# Patient Record
Sex: Female | Born: 1950
Health system: Southern US, Community
[De-identification: ages and names within clinical notes are randomized; demographics above are authoritative.]

## PROBLEM LIST (undated history)

## (undated) DIAGNOSIS — K219 Gastro-esophageal reflux disease without esophagitis: Secondary | ICD-10-CM

## (undated) DIAGNOSIS — E785 Hyperlipidemia, unspecified: Secondary | ICD-10-CM

## (undated) DIAGNOSIS — M1990 Unspecified osteoarthritis, unspecified site: Secondary | ICD-10-CM

## (undated) DIAGNOSIS — K5790 Diverticulosis of intestine, part unspecified, without perforation or abscess without bleeding: Secondary | ICD-10-CM

## (undated) DIAGNOSIS — I10 Essential (primary) hypertension: Secondary | ICD-10-CM

## (undated) DIAGNOSIS — K2960 Other gastritis without bleeding: Secondary | ICD-10-CM

## (undated) DIAGNOSIS — J302 Other seasonal allergic rhinitis: Secondary | ICD-10-CM

## (undated) DIAGNOSIS — T7840XA Allergy, unspecified, initial encounter: Secondary | ICD-10-CM

## (undated) HISTORY — DX: Diverticulosis of intestine, part unspecified, without perforation or abscess without bleeding: K57.90

## (undated) HISTORY — DX: Essential (primary) hypertension: I10

## (undated) HISTORY — DX: Unspecified osteoarthritis, unspecified site: M19.90

## (undated) HISTORY — DX: Gastro-esophageal reflux disease without esophagitis: K21.9

## (undated) HISTORY — DX: Other seasonal allergic rhinitis: J30.2

## (undated) HISTORY — DX: Hyperlipidemia, unspecified: E78.5

## (undated) HISTORY — DX: Allergy, unspecified, initial encounter: T78.40XA

## (undated) HISTORY — PX: COLONOSCOPY: SHX174

## (undated) HISTORY — DX: Other gastritis without bleeding: K29.60

## (undated) HISTORY — PX: WISDOM TOOTH EXTRACTION: SHX21

## (undated) HISTORY — PX: POLYPECTOMY: SHX149

---
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
## 1985-10-17 HISTORY — PX: BREAST BIOPSY: SHX20

## 1985-10-17 HISTORY — PX: BREAST EXCISIONAL BIOPSY: SUR124

## 1999-07-08 ENCOUNTER — Other Ambulatory Visit: Admission: RE | Admit: 1999-07-08 | Discharge: 1999-07-08 | Payer: Self-pay | Admitting: Obstetrics & Gynecology

## 2001-05-01 ENCOUNTER — Other Ambulatory Visit: Admission: RE | Admit: 2001-05-01 | Discharge: 2001-05-01 | Payer: Self-pay | Admitting: Obstetrics & Gynecology

## 2001-06-29 ENCOUNTER — Ambulatory Visit (HOSPITAL_COMMUNITY): Admission: RE | Admit: 2001-06-29 | Discharge: 2001-06-29 | Payer: Self-pay | Admitting: Gastroenterology

## 2001-06-29 ENCOUNTER — Encounter: Payer: Self-pay | Admitting: Gastroenterology

## 2002-07-05 ENCOUNTER — Other Ambulatory Visit: Admission: RE | Admit: 2002-07-05 | Discharge: 2002-07-05 | Payer: Self-pay | Admitting: Obstetrics & Gynecology

## 2003-08-11 ENCOUNTER — Other Ambulatory Visit: Admission: RE | Admit: 2003-08-11 | Discharge: 2003-08-11 | Payer: Self-pay | Admitting: Family Medicine

## 2004-10-05 ENCOUNTER — Other Ambulatory Visit: Admission: RE | Admit: 2004-10-05 | Discharge: 2004-10-05 | Payer: Self-pay | Admitting: Obstetrics & Gynecology

## 2005-01-05 ENCOUNTER — Encounter: Admission: RE | Admit: 2005-01-05 | Discharge: 2005-01-05 | Payer: Self-pay | Admitting: Family Medicine

## 2005-05-16 ENCOUNTER — Ambulatory Visit: Payer: Self-pay | Admitting: Gastroenterology

## 2006-08-15 ENCOUNTER — Other Ambulatory Visit: Admission: RE | Admit: 2006-08-15 | Discharge: 2006-08-15 | Payer: Self-pay | Admitting: Family Medicine

## 2006-08-21 ENCOUNTER — Encounter: Admission: RE | Admit: 2006-08-21 | Discharge: 2006-08-21 | Payer: Self-pay | Admitting: Family Medicine

## 2007-08-23 ENCOUNTER — Encounter: Admission: RE | Admit: 2007-08-23 | Discharge: 2007-08-23 | Payer: Self-pay | Admitting: Family Medicine

## 2008-08-26 ENCOUNTER — Encounter: Admission: RE | Admit: 2008-08-26 | Discharge: 2008-08-26 | Payer: Self-pay | Admitting: Family Medicine

## 2008-09-01 ENCOUNTER — Other Ambulatory Visit: Admission: RE | Admit: 2008-09-01 | Discharge: 2008-09-01 | Payer: Self-pay | Admitting: Family Medicine

## 2008-09-05 ENCOUNTER — Encounter: Admission: RE | Admit: 2008-09-05 | Discharge: 2008-09-05 | Payer: Self-pay | Admitting: Family Medicine

## 2009-09-17 ENCOUNTER — Encounter: Admission: RE | Admit: 2009-09-17 | Discharge: 2009-09-17 | Payer: Self-pay | Admitting: Family Medicine

## 2010-02-03 ENCOUNTER — Encounter: Admission: RE | Admit: 2010-02-03 | Discharge: 2010-02-03 | Payer: Self-pay | Admitting: Family Medicine

## 2010-09-07 ENCOUNTER — Other Ambulatory Visit
Admission: RE | Admit: 2010-09-07 | Discharge: 2010-09-07 | Payer: Self-pay | Source: Home / Self Care | Admitting: Family Medicine

## 2010-11-12 ENCOUNTER — Other Ambulatory Visit: Payer: Self-pay | Admitting: Family Medicine

## 2010-11-12 DIAGNOSIS — Z1239 Encounter for other screening for malignant neoplasm of breast: Secondary | ICD-10-CM

## 2010-11-29 ENCOUNTER — Ambulatory Visit
Admission: RE | Admit: 2010-11-29 | Discharge: 2010-11-29 | Disposition: A | Discharge status: Home / Self Care | Payer: BC Managed Care – PPO | Source: Ambulatory Visit | Attending: Family Medicine | Admitting: Family Medicine

## 2010-11-29 DIAGNOSIS — Z1239 Encounter for other screening for malignant neoplasm of breast: Secondary | ICD-10-CM

## 2011-10-28 ENCOUNTER — Other Ambulatory Visit: Payer: Self-pay | Admitting: Family Medicine

## 2011-10-28 ENCOUNTER — Encounter: Payer: Self-pay | Admitting: Gastroenterology

## 2011-10-28 DIAGNOSIS — Z1231 Encounter for screening mammogram for malignant neoplasm of breast: Secondary | ICD-10-CM

## 2011-11-10 ENCOUNTER — Encounter: Payer: Self-pay | Admitting: Gastroenterology

## 2011-11-10 ENCOUNTER — Ambulatory Visit (INDEPENDENT_AMBULATORY_CARE_PROVIDER_SITE_OTHER): Payer: BC Managed Care – PPO | Admitting: Gastroenterology

## 2011-11-10 VITALS — BP 124/76 | HR 68 | Ht 65.0 in | Wt 190.0 lb

## 2011-11-10 DIAGNOSIS — K219 Gastro-esophageal reflux disease without esophagitis: Secondary | ICD-10-CM

## 2011-11-10 DIAGNOSIS — Z1211 Encounter for screening for malignant neoplasm of colon: Secondary | ICD-10-CM

## 2011-11-10 NOTE — Progress Notes (Signed)
History of Present Illness: This is a 61 year old female with a long history of GERD that is well controlled on daily pantoprazole. She has a history of an incomplete colonoscopy in September 2002 due to a tortuous colon. The exam was complete the hepatic flexure. A subsequent barium enema was performed that showed a tortuous colon and mild diverticulosis. Denies weight loss, abdominal pain, constipation, diarrhea, change in stool caliber, melena, hematochezia, nausea, vomiting, dysphagia,  chest pain.  Allergies  Allergen Reactions  . Tetracyclines & Related Nausea Only   No outpatient prescriptions prior to visit.   Past Medical History  Diagnosis Date  . Erosive gastritis   . Arthritis   . Seasonal allergic rhinitis   . Diverticulosis   . Gastric ulcer 06/2001  . Hyperlipemia   . Hypertension    Past Surgical History  Procedure Date  . Tonsillectomy 1970  . Cesarean section 1987  . Breast biopsy 1987   History   Social History  . Marital Status: Married    Spouse Name: N/A    Number of Children: 1  . Years of Education: N/A   Occupational History  . [employer]    Social History Main Topics  . Smoking status: Never Smoker   . Smokeless tobacco: Never Used  . Alcohol Use: No  . Drug Use: No  . Sexually Active: None   Other Topics Concern  . None   Social History Narrative   Caffeine daily    Family History  Problem Relation Age of Onset  . Colon polyps Mother   . Diverticulitis Mother   . Uterine cancer Paternal Aunt   . Colon cancer Neg Hx   . Heart disease Father    Review of Systems: Pertinent positive and negative review of systems were noted in the above HPI section. All other review of systems were otherwise negative.  Physical Exam: General: Well developed , well nourished, no acute distress Head: Normocephalic and atraumatic Eyes:  sclerae anicteric, EOMI Ears: Normal auditory acuity Mouth: No deformity or lesions Neck: Supple, no masses or  thyromegaly Lungs: Clear throughout to auscultation Heart: Regular rate and rhythm; no murmurs, rubs or bruits Abdomen: Soft, non tender and non distended. No masses, hepatosplenomegaly or hernias noted. Normal Bowel sounds Rectal: Deferred to colonoscopy Musculoskeletal: Symmetrical with no gross deformities  Skin: No lesions on visible extremities Pulses:  Normal pulses noted Extremities: No clubbing, cyanosis, edema or deformities noted Neurological: Alert oriented x 4, grossly nonfocal Cervical Nodes:  No significant cervical adenopathy Inguinal Nodes: No significant inguinal adenopathy Psychological:  Alert and cooperative. Normal mood and affect  Assessment and Recommendations:  1. Colorectal cancer screening. Average risk. History of a tortuous colon with an incomplete colonoscopy in 2002. Deeper sedation with propofol and improved colonoscopes are likely to allow a complete examination to the cecum. Other options offered were a CT colonoscopy and air-contrast barium enema with flexible sigmoidoscopy. She prefers to proceed with colonoscopy with propofol sedation. The risks, benefits, and alternatives to colonoscopy with possible biopsy and possible polypectomy were discussed with the patient and they consent to proceed.   2. GERD. Continue standard antireflux measures and pantoprazole 40 mg daily.

## 2011-11-10 NOTE — Patient Instructions (Signed)
Please call back to schedule a Colonoscopy with propofol and a nurse visit to get your instructions.  Please let the nurse know that you need to get reglan prior to each prep dose that before your procedure.  cc: Blair Heys, MD

## 2011-12-12 ENCOUNTER — Ambulatory Visit
Admission: RE | Admit: 2011-12-12 | Discharge: 2011-12-12 | Disposition: A | Discharge status: Home / Self Care | Payer: BC Managed Care – PPO | Source: Ambulatory Visit | Attending: Family Medicine | Admitting: Family Medicine

## 2011-12-12 DIAGNOSIS — Z1231 Encounter for screening mammogram for malignant neoplasm of breast: Secondary | ICD-10-CM

## 2011-12-13 ENCOUNTER — Telehealth: Payer: Self-pay | Admitting: *Deleted

## 2011-12-13 ENCOUNTER — Encounter: Payer: Self-pay | Admitting: Gastroenterology

## 2011-12-13 ENCOUNTER — Ambulatory Visit (AMBULATORY_SURGERY_CENTER): Payer: BC Managed Care – PPO | Admitting: *Deleted

## 2011-12-13 VITALS — Ht 65.0 in | Wt 190.3 lb

## 2011-12-13 DIAGNOSIS — Z1211 Encounter for screening for malignant neoplasm of colon: Secondary | ICD-10-CM

## 2011-12-13 MED ORDER — PEG-KCL-NACL-NASULF-NA ASC-C 100 G PO SOLR: ORAL | Status: DC

## 2011-12-13 MED ORDER — METOCLOPRAMIDE HCL 10 MG PO TABS: ORAL_TABLET | ORAL | Status: DC

## 2011-12-13 NOTE — Progress Notes (Signed)
Patient says that she had a lot of nausea with last prep and would like to have something to take prep drinking prep.  Will send message to Dr. Russella Dar.

## 2011-12-13 NOTE — Telephone Encounter (Signed)
Dr. Russella Dar:  Pt would like to take Reglan before drinking prep for upcoming colonoscopy.  She had problems with nausea when drinking prep for previous colonoscopy.  Can she take Reglan 10 mg po 30 minutes before beginning pm dose of prep and Reglan 10 mg po 30 minutes before beginning am dose of prep?  Thanks. Ezra Sites

## 2011-12-13 NOTE — Telephone Encounter (Signed)
Rx for Reglan sent to pt's pharmacy.  Pt notified. Ezra Sites

## 2011-12-13 NOTE — Telephone Encounter (Signed)
Yes

## 2011-12-15 ENCOUNTER — Encounter: Payer: Self-pay | Admitting: Gastroenterology

## 2011-12-23 ENCOUNTER — Encounter: Payer: Self-pay | Admitting: Gastroenterology

## 2011-12-23 ENCOUNTER — Ambulatory Visit (AMBULATORY_SURGERY_CENTER): Payer: BC Managed Care – PPO | Admitting: Gastroenterology

## 2011-12-23 VITALS — BP 153/72 | HR 99 | Temp 96.4°F | Resp 18 | Ht 65.0 in | Wt 190.0 lb

## 2011-12-23 DIAGNOSIS — D126 Benign neoplasm of colon, unspecified: Secondary | ICD-10-CM

## 2011-12-23 DIAGNOSIS — Z1211 Encounter for screening for malignant neoplasm of colon: Secondary | ICD-10-CM

## 2011-12-23 MED ORDER — SODIUM CHLORIDE 0.9 % IV SOLN: 500.0000 mL | INTRAVENOUS | Status: DC

## 2011-12-23 NOTE — Patient Instructions (Signed)

## 2011-12-23 NOTE — Op Note (Signed)
[facility] [address] [place], [place]  [address]  COLONOSCOPY PROCEDURE REPORT PATIENT:  Morgan Green, Morgan Green  MR#:  [id number] BIRTHDATE:  [birth year], 60 yrs. old  GENDER:  female ENDOSCOPIST:  Judie Petit T. Russella Dar, MD, [facility]  PROCEDURE DATE:  12/23/2011 PROCEDURE:  Colonoscopy with snare polypectomy ASA CLASS:  Class I INDICATIONS:  1) Routine Risk Screening MEDICATIONS:   MAC sedation, administered by CRNA, propofol (Diprivan) 400 mg IV DESCRIPTION OF PROCEDURE:   After the risks benefits and alternatives of the procedure were thoroughly explained, informed consent was obtained.  Digital rectal exam was performed and revealed no abnormalities.   The LB CF-H180AL [id number] endoscope was introduced through the anus and advanced to the cecum, which was identified by both the appendix and ileocecal valve, without limitations.  The quality of the prep was adequate, using MoviPrep.  The instrument was then slowly withdrawn as the colon was fully examined. <<PROCEDUREIMAGES>> FINDINGS:  A sessile polyp was found in the ascending colon. It was 12 mm in size. Polyp was snared, then cauterized with monopolar cautery. Retrieval was successful. Piecemeal polypectomy.  A sessile polyp was found in the distal transverse colon. It was 7 mm in size. Polyp was snared, then cauterized with monopolar cautery. Retrieval was successful. Mild diverticulosis was found in the sigmoid colon. Otherwise normal colonoscopy without other polyps, masses, vascular ectasias, or inflammatory changes.  Retroflexed views in the rectum revealed no abnormalities.    The time to cecum =  3.5  minutes. The scope was then withdrawn (time =  19.5  min) from the patient and the procedure completed.  COMPLICATIONS:  None  ENDOSCOPIC IMPRESSION: 1) 12 mm sessile polyp in the ascending colon 2) 7 mm sessile polyp in the distal transverse colon 3) Mild diverticulosis in the sigmoid colon  RECOMMENDATIONS: 1)  Hold aspirin, aspirin products, and anti-inflammatory medication for 2 weeks. 2) Await pathology results 3) High fiber diet with liberal fluid intake. 4) Repeat Colonoscopy in 1 year if ascending colon polyp is adenomatous, 5 years if the other polyps is adenomatous, otherwise 10 years  Morgan Green T. Russella Dar, MD, [facility]  n. eSIGNED:   Venita Lick. Mareesa Gathright at 12/23/2011 10:34 AM  Marlou Sa, [phone]

## 2011-12-23 NOTE — Progress Notes (Signed)
Patient did not experience any of the following events: a burn prior to discharge; a fall within the facility; wrong site/side/patient/procedure/implant event; or a hospital transfer or hospital admission upon discharge from the facility. (G8907) Patient did not have preoperative order for IV antibiotic SSI prophylaxis. (G8918)  

## 2011-12-26 ENCOUNTER — Telehealth: Payer: Self-pay | Admitting: *Deleted

## 2011-12-26 NOTE — Telephone Encounter (Signed)
No answer message left

## 2011-12-30 ENCOUNTER — Telehealth: Payer: Self-pay | Admitting: Gastroenterology

## 2011-12-30 NOTE — Telephone Encounter (Signed)
Attempted to return her call.  No answer and no machine.  I will try and call later.

## 2011-12-30 NOTE — Telephone Encounter (Signed)
Attempted to call again- no answer.

## 2011-12-31 ENCOUNTER — Encounter: Payer: Self-pay | Admitting: Gastroenterology

## 2012-01-02 NOTE — Telephone Encounter (Signed)
Reviewed path results.  She is advised she will be getting a letter in the mail.

## 2012-11-08 ENCOUNTER — Other Ambulatory Visit (HOSPITAL_COMMUNITY)
Admission: RE | Admit: 2012-11-08 | Discharge: 2012-11-08 | Disposition: A | Discharge status: Home / Self Care | Payer: BC Managed Care – PPO | Source: Ambulatory Visit | Attending: Family Medicine | Admitting: Family Medicine

## 2012-11-08 ENCOUNTER — Other Ambulatory Visit: Payer: Self-pay | Admitting: Family Medicine

## 2012-11-08 DIAGNOSIS — Z01419 Encounter for gynecological examination (general) (routine) without abnormal findings: Secondary | ICD-10-CM | POA: Insufficient documentation

## 2012-11-20 ENCOUNTER — Other Ambulatory Visit: Payer: Self-pay | Admitting: Family Medicine

## 2012-11-20 DIAGNOSIS — Z1231 Encounter for screening mammogram for malignant neoplasm of breast: Secondary | ICD-10-CM

## 2012-12-05 ENCOUNTER — Encounter: Payer: Self-pay | Admitting: Gastroenterology

## 2012-12-14 ENCOUNTER — Ambulatory Visit
Admission: RE | Admit: 2012-12-14 | Discharge: 2012-12-14 | Disposition: A | Discharge status: Home / Self Care | Payer: BC Managed Care – PPO | Source: Ambulatory Visit | Attending: Family Medicine | Admitting: Family Medicine

## 2012-12-19 ENCOUNTER — Telehealth: Payer: Self-pay | Admitting: *Deleted

## 2012-12-19 ENCOUNTER — Ambulatory Visit (AMBULATORY_SURGERY_CENTER): Payer: BC Managed Care – PPO | Admitting: *Deleted

## 2012-12-19 ENCOUNTER — Encounter: Payer: Self-pay | Admitting: Gastroenterology

## 2012-12-19 VITALS — Ht 65.0 in | Wt 192.8 lb

## 2012-12-19 DIAGNOSIS — Z1211 Encounter for screening for malignant neoplasm of colon: Secondary | ICD-10-CM

## 2012-12-19 DIAGNOSIS — Z86010 Personal history of colonic polyps: Secondary | ICD-10-CM

## 2012-12-19 MED ORDER — NA SULFATE-K SULFATE-MG SULF 17.5-3.13-1.6 GM/177ML PO SOLN: ORAL | Status: DC

## 2012-12-19 MED ORDER — METOCLOPRAMIDE HCL 10 MG PO TABS: ORAL_TABLET | ORAL | Status: DC

## 2012-12-19 NOTE — Telephone Encounter (Signed)
Patient is for Recall colonoscopy on 12-28-12. Pt would like to take Reglan before drinking prep. Patient also request smaller volume prep. Can patient have Suprep? Can patient have Reglan 10 mg 30 minutes before each prep dose? Thanks

## 2012-12-19 NOTE — Telephone Encounter (Signed)
SuPrep is fine with me if the patient asks for a lower volume prep-no need to ask Reglan 10 mg before both prep doses is OK

## 2012-12-19 NOTE — Telephone Encounter (Signed)
Noted. Reglan sent to pharmacy with SuPrep. Thanks.

## 2012-12-28 ENCOUNTER — Encounter: Payer: Self-pay | Admitting: Gastroenterology

## 2012-12-28 ENCOUNTER — Ambulatory Visit (AMBULATORY_SURGERY_CENTER): Payer: BC Managed Care – PPO | Admitting: Gastroenterology

## 2012-12-28 VITALS — BP 151/87 | HR 73 | Temp 97.6°F | Resp 22 | Ht 65.0 in | Wt 192.0 lb

## 2012-12-28 DIAGNOSIS — D126 Benign neoplasm of colon, unspecified: Secondary | ICD-10-CM

## 2012-12-28 MED ORDER — SODIUM CHLORIDE 0.9 % IV SOLN: 500.0000 mL | INTRAVENOUS | Status: DC

## 2012-12-28 NOTE — Progress Notes (Signed)
Called to room to assist during endoscopic procedure.  Patient ID and intended procedure confirmed with present staff. Received instructions for my participation in the procedure from the performing physician. ewm 

## 2012-12-28 NOTE — Progress Notes (Signed)
Patient did not experience any of the following events: a burn prior to discharge; a fall within the facility; wrong site/side/patient/procedure/implant event; or a hospital transfer or hospital admission upon discharge from the facility. (G8907)  

## 2012-12-28 NOTE — Op Note (Signed)
[facility] [address] [place], [address]   COLONOSCOPY PROCEDURE REPORT  PATIENT: Morgan, Green  MR#: [id number] BIRTHDATE: 01/27/1951 , 61  yrs. old GENDER: Female ENDOSCOPIST: Meryl Dare, MD, [facility] PROCEDURE DATE:  12/28/2012 PROCEDURE:   Colonoscopy with snare polypectomy ASA CLASS:   Class II INDICATIONS:Patient's personal history of adenomatous colon polyps.  MEDICATIONS: MAC sedation, administered by CRNA and propofol (Diprivan) 330mg  IV DESCRIPTION OF PROCEDURE:   After the risks benefits and alternatives of the procedure were thoroughly explained, informed consent was obtained.  A digital rectal exam revealed no abnormalities of the rectum.   The LB CF-H180AL [id number]  endoscope was introduced through the anus and advanced to the cecum, which was identified by both the appendix and ileocecal valve. No adverse events experienced.   The quality of the prep was good, using MoviPrep  The instrument was then slowly withdrawn as the colon was fully examined.  COLON FINDINGS: A sessile polyp measuring 8 mm in size was found at the cecum.  A polypectomy was performed using snare cautery.  The resection was complete and the polyp tissue was completely retrieved.   5 mm angiodysplastic lesion was found at the cecum. Prior polypectomy site noted in the ascending colon. The colon was otherwise normal.  There was no diverticulosis, inflammation, polyps or cancers unless previously stated.  Retroflexed views revealed no abnormalities. The time to cecum=3 minutes 14 seconds. Withdrawal time=11 minutes 12 seconds.  The scope was withdrawn and the procedure completed.  COMPLICATIONS: There were no complications.  ENDOSCOPIC IMPRESSION: 1.   Sessile polyp measuring 8 mm in size was found at the cecum; polypectomy was performed using snare cautery 2.   5mm angiodysplastic lesion and at the cecum 3.   Prior polypectomy site  RECOMMENDATIONS: 1.   Await pathology results 2.  Hold aspirin, aspirin products, and anti-inflammatory medication for 2 weeks. 3.  Repeat Colonoscopy in 3 years.   eSigned:  Meryl Dare, MD, [facility] 12/28/2012 9:57 AM   cc: Blair Heys, MD

## 2012-12-28 NOTE — Progress Notes (Signed)
No complaints noted in the recovery room. Maw   

## 2012-12-28 NOTE — Progress Notes (Signed)
Patient did not have preoperative order for IV antibiotic SSI prophylaxis. (G8918)   

## 2012-12-28 NOTE — Progress Notes (Signed)
Report to pacu RN, vss, bbs=clear 

## 2012-12-28 NOTE — Patient Instructions (Addendum)
A handout was given to your care partner on polyps.  Please hold aspirin, aspirin products and any anti-inflammatory medications for two weeks.  You may resume your other current medications today.  Please call if any questions or concerns.    YOU HAD AN ENDOSCOPIC PROCEDURE TODAY AT THE [facility]: Refer to the procedure report that was given to you for any specific questions about what was found during the examination.  If the procedure report does not answer your questions, please call your gastroenterologist to clarify.  If you requested that your care partner not be given the details of your procedure findings, then the procedure report has been included in a sealed envelope for you to review at your convenience later.  YOU SHOULD EXPECT: Some feelings of bloating in the abdomen. Passage of more gas than usual.  Walking can help get rid of the air that was put into your GI tract during the procedure and reduce the bloating. If you had a lower endoscopy (such as a colonoscopy or flexible sigmoidoscopy) you may notice spotting of blood in your stool or on the toilet paper. If you underwent a bowel prep for your procedure, then you may not have a normal bowel movement for a few days.  DIET: Your first meal following the procedure should be a light meal and then it is ok to progress to your normal diet.  A half-sandwich or bowl of soup is an example of a good first meal.  Heavy or fried foods are harder to digest and may make you feel nauseous or bloated.  Likewise meals heavy in dairy and vegetables can cause extra gas to form and this can also increase the bloating.  Drink plenty of fluids but you should avoid alcoholic beverages for 24 hours.  ACTIVITY: Your care partner should take you home directly after the procedure.  You should plan to take it easy, moving slowly for the rest of the day.  You can resume normal activity the day after the procedure however you should NOT DRIVE or use  heavy machinery for 24 hours (because of the sedation medicines used during the test).    SYMPTOMS TO REPORT IMMEDIATELY: A gastroenterologist can be reached at any hour.  During normal business hours, 8:30 AM to 5:00 PM Monday through Friday, call [phone].  After hours and on weekends, please call the GI answering service at [phone] who will take a message and have the physician on call contact you.   Following lower endoscopy (colonoscopy or flexible sigmoidoscopy):  Excessive amounts of blood in the stool  Significant tenderness or worsening of abdominal pains  Swelling of the abdomen that is new, acute  Fever of 100F or higher   FOLLOW UP: If any biopsies were taken you will be contacted by phone or by letter within the next 1-3 weeks.  Call your gastroenterologist if you have not heard about the biopsies in 3 weeks.  Our staff will call the home number listed on your records the next business day following your procedure to check on you and address any questions or concerns that you may have at that time regarding the information given to you following your procedure. This is a courtesy call and so if there is no answer at the home number and we have not heard from you through the emergency physician on call, we will assume that you have returned to your regular daily activities without incident.  SIGNATURES/CONFIDENTIALITY: You and/or your care  partner have signed paperwork which will be entered into your electronic medical record.  These signatures attest to the fact that that the information above on your After Visit Summary has been reviewed and is understood.  Full responsibility of the confidentiality of this discharge information lies with you and/or your care-partner.

## 2012-12-31 ENCOUNTER — Telehealth: Payer: Self-pay

## 2012-12-31 NOTE — Telephone Encounter (Signed)
  Follow up Call-  Call back number 12/28/2012 12/23/2011  Post procedure Call Back phone  # [phone]- no answering machine [phone]  Permission to leave phone message No Yes     Patient questions:  Do you have a fever, pain , or abdominal swelling? no Pain Score  0 *  Have you tolerated food without any problems? yes  Have you been able to return to your normal activities? yes  Do you have any questions about your discharge instructions: Diet   no Medications  no Follow up visit  no  Do you have questions or concerns about your Care? no  Actions: * If pain score is 4 or above: No action needed, pain <4.

## 2013-01-06 ENCOUNTER — Encounter: Payer: Self-pay | Admitting: Gastroenterology

## 2013-11-27 ENCOUNTER — Other Ambulatory Visit: Payer: Self-pay

## 2013-11-27 DIAGNOSIS — Z1231 Encounter for screening mammogram for malignant neoplasm of breast: Secondary | ICD-10-CM

## 2013-12-17 ENCOUNTER — Ambulatory Visit
Admission: RE | Admit: 2013-12-17 | Discharge: 2013-12-17 | Disposition: A | Discharge status: Home / Self Care | Payer: BC Managed Care – PPO | Source: Ambulatory Visit

## 2013-12-17 DIAGNOSIS — Z1231 Encounter for screening mammogram for malignant neoplasm of breast: Secondary | ICD-10-CM

## 2014-12-08 ENCOUNTER — Other Ambulatory Visit: Payer: Self-pay

## 2014-12-08 DIAGNOSIS — Z1231 Encounter for screening mammogram for malignant neoplasm of breast: Secondary | ICD-10-CM

## 2014-12-29 ENCOUNTER — Ambulatory Visit
Admission: RE | Admit: 2014-12-29 | Discharge: 2014-12-29 | Disposition: A | Discharge status: Home / Self Care | Payer: BC Managed Care – PPO | Source: Ambulatory Visit

## 2014-12-29 DIAGNOSIS — Z1231 Encounter for screening mammogram for malignant neoplasm of breast: Secondary | ICD-10-CM

## 2015-11-24 ENCOUNTER — Other Ambulatory Visit: Payer: Self-pay

## 2015-11-24 DIAGNOSIS — Z1231 Encounter for screening mammogram for malignant neoplasm of breast: Secondary | ICD-10-CM

## 2015-11-26 ENCOUNTER — Encounter: Payer: Self-pay | Admitting: Gastroenterology

## 2015-12-16 ENCOUNTER — Other Ambulatory Visit (HOSPITAL_COMMUNITY)
Admission: RE | Admit: 2015-12-16 | Discharge: 2015-12-16 | Disposition: A | Discharge status: Home / Self Care | Payer: BC Managed Care – PPO | Source: Ambulatory Visit | Attending: Family Medicine | Admitting: Family Medicine

## 2015-12-16 ENCOUNTER — Other Ambulatory Visit: Payer: Self-pay | Admitting: Family Medicine

## 2015-12-16 DIAGNOSIS — Z01419 Encounter for gynecological examination (general) (routine) without abnormal findings: Secondary | ICD-10-CM | POA: Diagnosis not present

## 2015-12-18 LAB — CYTOLOGY - PAP

## 2015-12-30 ENCOUNTER — Ambulatory Visit
Admission: RE | Admit: 2015-12-30 | Discharge: 2015-12-30 | Disposition: A | Discharge status: Home / Self Care | Payer: BC Managed Care – PPO | Source: Ambulatory Visit

## 2015-12-30 DIAGNOSIS — Z1231 Encounter for screening mammogram for malignant neoplasm of breast: Secondary | ICD-10-CM

## 2016-01-01 ENCOUNTER — Encounter: Payer: Self-pay | Admitting: Gastroenterology

## 2016-02-24 ENCOUNTER — Ambulatory Visit (AMBULATORY_SURGERY_CENTER): Payer: Self-pay | Admitting: *Deleted

## 2016-02-24 VITALS — Ht 65.0 in | Wt 188.0 lb

## 2016-02-24 DIAGNOSIS — Z86010 Personal history of colonic polyps: Secondary | ICD-10-CM

## 2016-02-24 MED ORDER — NA SULFATE-K SULFATE-MG SULF 17.5-3.13-1.6 GM/177ML PO SOLN: 1.0000 | Freq: Once | ORAL | Status: DC

## 2016-02-24 NOTE — Progress Notes (Signed)
No egg or soy allergy known to patient  No issues with past sedation with any surgeries  or procedures, no intubation problems  No diet pills per patient No home 02 use per patient  No blood thinners per patient  Pt denies issues with constipation   

## 2016-02-25 ENCOUNTER — Encounter: Payer: Self-pay | Admitting: Gastroenterology

## 2016-03-09 ENCOUNTER — Ambulatory Visit (AMBULATORY_SURGERY_CENTER): Payer: BC Managed Care – PPO | Admitting: Gastroenterology

## 2016-03-09 ENCOUNTER — Encounter: Payer: Self-pay | Admitting: Gastroenterology

## 2016-03-09 VITALS — BP 137/83 | HR 79 | Temp 98.4°F | Resp 16 | Ht 65.0 in | Wt 188.0 lb

## 2016-03-09 DIAGNOSIS — Z86010 Personal history of colonic polyps: Secondary | ICD-10-CM

## 2016-03-09 MED ORDER — SODIUM CHLORIDE 0.9 % IV SOLN: 500.0000 mL | INTRAVENOUS | Status: DC

## 2016-03-09 NOTE — Patient Instructions (Signed)
Discharge instructions given. Normal exam. Resume previous medications. YOU HAD AN ENDOSCOPIC PROCEDURE TODAY AT THE [facility]:   Refer to the procedure report that was given to you for any specific questions about what was found during the examination.  If the procedure report does not answer your questions, please call your gastroenterologist to clarify.  If you requested that your care partner not be given the details of your procedure findings, then the procedure report has been included in a sealed envelope for you to review at your convenience later.  YOU SHOULD EXPECT: Some feelings of bloating in the abdomen. Passage of more gas than usual.  Walking can help get rid of the air that was put into your GI tract during the procedure and reduce the bloating. If you had a lower endoscopy (such as a colonoscopy or flexible sigmoidoscopy) you may notice spotting of blood in your stool or on the toilet paper. If you underwent a bowel prep for your procedure, you may not have a normal bowel movement for a few days.  Please Note:  You might notice some irritation and congestion in your nose or some drainage.  This is from the oxygen used during your procedure.  There is no need for concern and it should clear up in a day or so.  SYMPTOMS TO REPORT IMMEDIATELY:   Following lower endoscopy (colonoscopy or flexible sigmoidoscopy):  Excessive amounts of blood in the stool  Significant tenderness or worsening of abdominal pains  Swelling of the abdomen that is new, acute  Fever of 100F or higher   For urgent or emergent issues, a gastroenterologist can be reached at any hour by calling [phone].   DIET: Your first meal following the procedure should be a small meal and then it is ok to progress to your normal diet. Heavy or fried foods are harder to digest and may make you feel nauseous or bloated.  Likewise, meals heavy in dairy and vegetables can increase bloating.  Drink plenty  of fluids but you should avoid alcoholic beverages for 24 hours.  ACTIVITY:  You should plan to take it easy for the rest of today and you should NOT DRIVE or use heavy machinery until tomorrow (because of the sedation medicines used during the test).    FOLLOW UP: Our staff will call the number listed on your records the next business day following your procedure to check on you and address any questions or concerns that you may have regarding the information given to you following your procedure. If we do not reach you, we will leave a message.  However, if you are feeling well and you are not experiencing any problems, there is no need to return our call.  We will assume that you have returned to your regular daily activities without incident.  If any biopsies were taken you will be contacted by phone or by letter within the next 1-3 weeks.  Please call us at [phone] if you have not heard about the biopsies in 3 weeks.    SIGNATURES/CONFIDENTIALITY: You and/or your care partner have signed paperwork which will be entered into your electronic medical record.  These signatures attest to the fact that that the information above on your After Visit Summary has been reviewed and is understood.  Full responsibility of the confidentiality of this discharge information lies with you and/or your care-partner. 

## 2016-03-09 NOTE — Progress Notes (Signed)
To pacu vss patent aw report to rn 

## 2016-03-09 NOTE — Op Note (Signed)
[facility] Patient Name: Morgan Green Procedure Date: 03/09/2016 11:05 AM MRN: [id number] Endoscopist: Ladene Artist , MD Age: 65 Referring MD:  Date of Birth: 28-Oct-1950 Gender: Female Procedure:                Colonoscopy Indications:              Surveillance: Personal history of adenomatous                            polyps on last colonoscopy > 3 years ago Medicines:                Monitored Anesthesia Care Procedure:                Pre-Anesthesia Assessment:                           - Prior to the procedure, a History and Physical                            was performed, and patient medications and                            allergies were reviewed. The patient's tolerance of                            previous anesthesia was also reviewed. The risks                            and benefits of the procedure and the sedation                            options and risks were discussed with the patient.                            All questions were answered, and informed consent                            was obtained. Prior Anticoagulants: The patient has                            taken no previous anticoagulant or antiplatelet                            agents. ASA Grade Assessment: II - A patient with                            mild systemic disease. After reviewing the risks                            and benefits, the patient was deemed in                            satisfactory condition to undergo the procedure.  After obtaining informed consent, the colonoscope                            was passed under direct vision. Throughout the                            procedure, the patient's blood pressure, pulse, and                            oxygen saturations were monitored continuously. The                            Model PCF-H190L [id number]) scope was introduced                            through the anus and advanced to the  the cecum,                            identified by appendiceal orifice and ileocecal                            valve. The colonoscopy was somewhat difficult due                            to a tortuous colon. Successful completion of the                            procedure was aided by using manual pressure. The                            patient tolerated the procedure well. The quality                            of the bowel preparation was adequate after                            extensive rinsing and suctioning. The ileocecal                            valve, appendiceal orifice, and rectum were                            photographed. Scope In: 11:19:10 AM Scope Out: 11:38:45 AM Scope Withdrawal Time: 0 hours 15 minutes 19 seconds  Total Procedure Duration: 0 hours 19 minutes 35 seconds  Findings:                 The digital rectal exam was normal.                           A single small angioectasia without bleeding was                            found in the cecum.  The exam was otherwise normal throughout the                            examined colon.                           Retroflexion in the rectum was not performed due to                            narrow rectal vault anatomy. Complications:            No immediate complications. Estimated Blood Loss:     Estimated blood loss: none. Impression:               - A single non-bleeding colonic angioectasia.                           - Otherwise normal colonoscopy Recommendation:           - Patient has a contact number available for                            emergencies. The signs and symptoms of potential                            delayed complications were discussed with the                            patient. Return to normal activities tomorrow.                            Written discharge instructions were provided to the                            patient.                           -  Resume previous diet.                           - Continue present medications.                           - Repeat colonoscopy in 5 years for surveillance. Ladene Artist, MD 03/09/2016 11:52:47 AM This report has been signed electronically.

## 2016-03-10 ENCOUNTER — Telehealth: Payer: Self-pay | Admitting: *Deleted

## 2016-03-10 NOTE — Telephone Encounter (Signed)
  Follow up Call-  Call back number 03/09/2016  Post procedure Call Back phone  # [phone]  Permission to leave phone message No  comments NO VOICEMAIL     Patient questions:  Do you have a fever, pain , or abdominal swelling? No. Pain Score  0 *  Have you tolerated food without any problems? Yes.    Have you been able to return to your normal activities? Yes.    Do you have any questions about your discharge instructions: Diet   No. Medications  No. Follow up visit  No.  Do you have questions or concerns about your Care? No.  Actions: * If pain score is 4 or above: No action needed, pain <4.

## 2016-11-30 ENCOUNTER — Other Ambulatory Visit: Payer: Self-pay | Admitting: Family Medicine

## 2016-11-30 DIAGNOSIS — Z1231 Encounter for screening mammogram for malignant neoplasm of breast: Secondary | ICD-10-CM

## 2017-01-04 ENCOUNTER — Ambulatory Visit
Admission: RE | Admit: 2017-01-04 | Discharge: 2017-01-04 | Disposition: A | Discharge status: Home / Self Care | Payer: BC Managed Care – PPO | Source: Ambulatory Visit | Attending: Family Medicine | Admitting: Family Medicine

## 2017-01-04 DIAGNOSIS — Z1231 Encounter for screening mammogram for malignant neoplasm of breast: Secondary | ICD-10-CM

## 2017-11-24 ENCOUNTER — Other Ambulatory Visit: Payer: Self-pay | Admitting: Family Medicine

## 2017-11-24 DIAGNOSIS — Z139 Encounter for screening, unspecified: Secondary | ICD-10-CM

## 2018-01-05 ENCOUNTER — Ambulatory Visit
Admission: RE | Admit: 2018-01-05 | Discharge: 2018-01-05 | Disposition: A | Discharge status: Home / Self Care | Payer: BC Managed Care – PPO | Source: Ambulatory Visit | Attending: Family Medicine | Admitting: Family Medicine

## 2018-01-05 DIAGNOSIS — Z139 Encounter for screening, unspecified: Secondary | ICD-10-CM

## 2018-11-29 ENCOUNTER — Other Ambulatory Visit: Payer: Self-pay | Admitting: Family Medicine

## 2018-11-29 DIAGNOSIS — Z1231 Encounter for screening mammogram for malignant neoplasm of breast: Secondary | ICD-10-CM

## 2019-01-23 ENCOUNTER — Ambulatory Visit: Payer: BC Managed Care – PPO

## 2019-03-13 ENCOUNTER — Other Ambulatory Visit: Payer: Self-pay

## 2019-03-13 ENCOUNTER — Ambulatory Visit
Admission: RE | Admit: 2019-03-13 | Discharge: 2019-03-13 | Disposition: A | Discharge status: Home / Self Care | Payer: Medicare Other | Source: Ambulatory Visit | Attending: Family Medicine | Admitting: Family Medicine

## 2019-03-13 DIAGNOSIS — Z1231 Encounter for screening mammogram for malignant neoplasm of breast: Secondary | ICD-10-CM

## 2019-04-10 ENCOUNTER — Other Ambulatory Visit: Payer: Self-pay | Admitting: Pediatric Intensive Care

## 2019-04-10 DIAGNOSIS — Z20822 Contact with and (suspected) exposure to covid-19: Secondary | ICD-10-CM

## 2019-04-14 LAB — NOVEL CORONAVIRUS, NAA: SARS-CoV-2, NAA: NOT DETECTED

## 2020-01-27 ENCOUNTER — Other Ambulatory Visit: Payer: Self-pay

## 2020-01-27 ENCOUNTER — Ambulatory Visit: Payer: Medicare PPO | Admitting: Dermatology

## 2020-01-27 DIAGNOSIS — D225 Melanocytic nevi of trunk: Secondary | ICD-10-CM

## 2020-01-27 DIAGNOSIS — L729 Follicular cyst of the skin and subcutaneous tissue, unspecified: Secondary | ICD-10-CM

## 2020-01-27 DIAGNOSIS — Z1283 Encounter for screening for malignant neoplasm of skin: Secondary | ICD-10-CM | POA: Diagnosis not present

## 2020-01-27 DIAGNOSIS — D229 Melanocytic nevi, unspecified: Secondary | ICD-10-CM

## 2020-01-27 DIAGNOSIS — L720 Epidermal cyst: Secondary | ICD-10-CM | POA: Diagnosis not present

## 2020-01-28 ENCOUNTER — Encounter: Payer: Self-pay | Admitting: Dermatology

## 2020-01-28 NOTE — Progress Notes (Signed)
   New Patient   Subjective  Morgan Green is a 69 y.o. female who presents for the following: Skin Problem (Here to check spot around right corner of lip. Patient thinks its a cyst. Its been there atleast 6 or more months. Patient says it does swell up and last week she was able to get stuff out of spot. ).  cyst Location: Lower lip Duration: 1 to 2 years Quality: Has inflamed Associated Signs/Symptoms: Some drainage Modifying Factors:  Severity:  Timing: Context: Patient desires removal   The following portions of the chart were reviewed this encounter and updated as appropriate:     Objective  Well appearing patient in no apparent distress; mood and affect are within normal limits.  All sun exposed areas plus back examined.   Assessment & Plan  Cyst of skin Right Lower [place] Lip   Pt strongly desires to schedule removal; warned of risks: scar, failure, non-insurance coverage.  Nevus (2) Right Lower Back; Mid Back  Annual skin check  I agree with Pearly that the dermal papule on her right outer lower lip represents a small epidermoid cyst in a somewhat unusual location.  Because of the history of inflammation, there will almost certainly be some fibrosis and removal may not be technically simple.  She will think this over and decide whether she wants to schedule this.  The remainder of her general skin examination was clear; no atypical moles or nonmelanoma skin cancer.

## 2020-02-13 ENCOUNTER — Ambulatory Visit
Admission: RE | Admit: 2020-02-13 | Discharge: 2020-02-13 | Disposition: A | Discharge status: Home / Self Care | Payer: Medicare PPO | Source: Ambulatory Visit | Attending: Specialist | Admitting: Specialist

## 2020-02-13 ENCOUNTER — Other Ambulatory Visit: Payer: Self-pay | Admitting: Specialist

## 2020-02-13 ENCOUNTER — Other Ambulatory Visit: Payer: Self-pay

## 2020-02-13 DIAGNOSIS — M25511 Pain in right shoulder: Secondary | ICD-10-CM

## 2020-02-13 DIAGNOSIS — S42291A Other displaced fracture of upper end of right humerus, initial encounter for closed fracture: Secondary | ICD-10-CM | POA: Diagnosis not present

## 2020-02-25 DIAGNOSIS — Z86010 Personal history of colonic polyps: Secondary | ICD-10-CM | POA: Diagnosis not present

## 2020-02-25 DIAGNOSIS — K219 Gastro-esophageal reflux disease without esophagitis: Secondary | ICD-10-CM | POA: Diagnosis not present

## 2020-02-25 DIAGNOSIS — Z0000 Encounter for general adult medical examination without abnormal findings: Secondary | ICD-10-CM | POA: Diagnosis not present

## 2020-02-25 DIAGNOSIS — I10 Essential (primary) hypertension: Secondary | ICD-10-CM | POA: Diagnosis not present

## 2020-02-25 DIAGNOSIS — E7800 Pure hypercholesterolemia, unspecified: Secondary | ICD-10-CM | POA: Diagnosis not present

## 2020-02-25 DIAGNOSIS — Z1389 Encounter for screening for other disorder: Secondary | ICD-10-CM | POA: Diagnosis not present

## 2020-02-27 DIAGNOSIS — S42231D 3-part fracture of surgical neck of right humerus, subsequent encounter for fracture with routine healing: Secondary | ICD-10-CM | POA: Diagnosis not present

## 2020-02-27 DIAGNOSIS — M25511 Pain in right shoulder: Secondary | ICD-10-CM | POA: Diagnosis not present

## 2020-03-12 DIAGNOSIS — M25511 Pain in right shoulder: Secondary | ICD-10-CM | POA: Diagnosis not present

## 2020-03-12 DIAGNOSIS — S42231D 3-part fracture of surgical neck of right humerus, subsequent encounter for fracture with routine healing: Secondary | ICD-10-CM | POA: Diagnosis not present

## 2020-03-18 DIAGNOSIS — M25511 Pain in right shoulder: Secondary | ICD-10-CM | POA: Diagnosis not present

## 2020-03-24 DIAGNOSIS — M25511 Pain in right shoulder: Secondary | ICD-10-CM | POA: Diagnosis not present

## 2020-04-03 DIAGNOSIS — M25511 Pain in right shoulder: Secondary | ICD-10-CM | POA: Diagnosis not present

## 2020-04-06 DIAGNOSIS — M25511 Pain in right shoulder: Secondary | ICD-10-CM | POA: Diagnosis not present

## 2020-04-15 DIAGNOSIS — M25511 Pain in right shoulder: Secondary | ICD-10-CM | POA: Diagnosis not present

## 2020-04-21 DIAGNOSIS — M25511 Pain in right shoulder: Secondary | ICD-10-CM | POA: Diagnosis not present

## 2020-04-28 DIAGNOSIS — M25511 Pain in right shoulder: Secondary | ICD-10-CM | POA: Diagnosis not present

## 2020-05-12 DIAGNOSIS — M25511 Pain in right shoulder: Secondary | ICD-10-CM | POA: Diagnosis not present

## 2020-05-14 DIAGNOSIS — M25511 Pain in right shoulder: Secondary | ICD-10-CM | POA: Diagnosis not present

## 2020-05-22 DIAGNOSIS — M25511 Pain in right shoulder: Secondary | ICD-10-CM | POA: Diagnosis not present

## 2020-06-03 DIAGNOSIS — M25511 Pain in right shoulder: Secondary | ICD-10-CM | POA: Diagnosis not present

## 2020-06-09 DIAGNOSIS — M25511 Pain in right shoulder: Secondary | ICD-10-CM | POA: Diagnosis not present

## 2020-06-25 DIAGNOSIS — M25511 Pain in right shoulder: Secondary | ICD-10-CM | POA: Diagnosis not present

## 2020-06-25 DIAGNOSIS — S42231D 3-part fracture of surgical neck of right humerus, subsequent encounter for fracture with routine healing: Secondary | ICD-10-CM | POA: Diagnosis not present

## 2020-08-27 DIAGNOSIS — K219 Gastro-esophageal reflux disease without esophagitis: Secondary | ICD-10-CM | POA: Diagnosis not present

## 2020-08-27 DIAGNOSIS — I10 Essential (primary) hypertension: Secondary | ICD-10-CM | POA: Diagnosis not present

## 2020-08-27 DIAGNOSIS — E7800 Pure hypercholesterolemia, unspecified: Secondary | ICD-10-CM | POA: Diagnosis not present

## 2020-08-27 DIAGNOSIS — Z86010 Personal history of colonic polyps: Secondary | ICD-10-CM | POA: Diagnosis not present

## 2020-12-23 ENCOUNTER — Other Ambulatory Visit: Payer: Self-pay | Admitting: Family Medicine

## 2020-12-23 DIAGNOSIS — Z1231 Encounter for screening mammogram for malignant neoplasm of breast: Secondary | ICD-10-CM

## 2021-02-06 DIAGNOSIS — H2513 Age-related nuclear cataract, bilateral: Secondary | ICD-10-CM | POA: Diagnosis not present

## 2021-02-17 ENCOUNTER — Other Ambulatory Visit: Payer: Self-pay

## 2021-02-17 ENCOUNTER — Ambulatory Visit
Admission: RE | Admit: 2021-02-17 | Discharge: 2021-02-17 | Disposition: A | Discharge status: Home / Self Care | Payer: Medicare PPO | Source: Ambulatory Visit | Attending: Family Medicine | Admitting: Family Medicine

## 2021-02-17 DIAGNOSIS — Z1231 Encounter for screening mammogram for malignant neoplasm of breast: Secondary | ICD-10-CM | POA: Diagnosis not present

## 2021-02-25 DIAGNOSIS — Z0000 Encounter for general adult medical examination without abnormal findings: Secondary | ICD-10-CM | POA: Diagnosis not present

## 2021-02-25 DIAGNOSIS — I10 Essential (primary) hypertension: Secondary | ICD-10-CM | POA: Diagnosis not present

## 2021-02-25 DIAGNOSIS — K219 Gastro-esophageal reflux disease without esophagitis: Secondary | ICD-10-CM | POA: Diagnosis not present

## 2021-02-25 DIAGNOSIS — Z86010 Personal history of colonic polyps: Secondary | ICD-10-CM | POA: Diagnosis not present

## 2021-02-25 DIAGNOSIS — E7800 Pure hypercholesterolemia, unspecified: Secondary | ICD-10-CM | POA: Diagnosis not present

## 2021-02-25 DIAGNOSIS — Z1389 Encounter for screening for other disorder: Secondary | ICD-10-CM | POA: Diagnosis not present

## 2021-03-17 ENCOUNTER — Ambulatory Visit (AMBULATORY_SURGERY_CENTER): Payer: Self-pay

## 2021-03-17 ENCOUNTER — Other Ambulatory Visit: Payer: Self-pay

## 2021-03-17 VITALS — Ht 65.0 in | Wt 176.0 lb

## 2021-03-17 DIAGNOSIS — Z86010 Personal history of colonic polyps: Secondary | ICD-10-CM

## 2021-03-17 MED ORDER — NA SULFATE-K SULFATE-MG SULF 17.5-3.13-1.6 GM/177ML PO SOLN: 1.0000 | Freq: Once | ORAL | 0 refills | Status: AC

## 2021-03-17 NOTE — Progress Notes (Signed)
No egg or soy allergy known to patient  No issues with past sedation with any surgeries or procedures Patient denies ever being told they had issues or difficulty with intubation  No FH of Malignant Hyperthermia No diet pills per patient No home 02 use per patient  No blood thinners per patient  Pt denies issues with constipation at this time;  No A fib or A flutter  EMMI video via MyChart  COVID 19 guidelines implemented in [facility] today with Pt and RN  Pt is fully vaccinated for Covid x 2 + booster; NO PA's for preps discussed with pt in [facility] today  Discussed with pt there will be an out-of-pocket cost for prep and that varies from $0 to 70 dollars  Due to the COVID-19 pandemic we are asking patients to follow certain guidelines.  Pt aware of COVID protocols and [facility] guidelines

## 2021-03-30 ENCOUNTER — Encounter: Payer: Self-pay | Admitting: Certified Registered Nurse Anesthetist

## 2021-03-31 ENCOUNTER — Encounter: Payer: Self-pay | Admitting: Gastroenterology

## 2021-03-31 ENCOUNTER — Other Ambulatory Visit: Payer: Self-pay

## 2021-03-31 ENCOUNTER — Ambulatory Visit (AMBULATORY_SURGERY_CENTER): Payer: Medicare PPO | Admitting: Gastroenterology

## 2021-03-31 VITALS — BP 142/80 | HR 71 | Temp 97.8°F | Resp 18 | Ht 65.0 in | Wt 176.0 lb

## 2021-03-31 DIAGNOSIS — I10 Essential (primary) hypertension: Secondary | ICD-10-CM | POA: Diagnosis not present

## 2021-03-31 DIAGNOSIS — Z86010 Personal history of colonic polyps: Secondary | ICD-10-CM | POA: Diagnosis not present

## 2021-03-31 DIAGNOSIS — K219 Gastro-esophageal reflux disease without esophagitis: Secondary | ICD-10-CM | POA: Diagnosis not present

## 2021-03-31 MED ORDER — SODIUM CHLORIDE 0.9 % IV SOLN: 500.0000 mL | Freq: Once | INTRAVENOUS | Status: AC

## 2021-03-31 NOTE — Op Note (Signed)
[facility] Patient Name: Nazaret Chea Procedure Date: 03/31/2021 3:10 PM MRN: [id number] Endoscopist: Ladene Artist , MD Age: 70 Referring MD:  Date of Birth: [birth year] Gender: Female Account #: [id number] Procedure:                Colonoscopy Indications:              Surveillance: Personal history of adenomatous                            polyps on last colonoscopy 5 years ago Medicines:                Monitored Anesthesia Care Procedure:                Pre-Anesthesia Assessment:                           - Prior to the procedure, a History and Physical                            was performed, and patient medications and                            allergies were reviewed. The patient's tolerance of                            previous anesthesia was also reviewed. The risks                            and benefits of the procedure and the sedation                            options and risks were discussed with the patient.                            All questions were answered, and informed consent                            was obtained. Prior Anticoagulants: The patient has                            taken no previous anticoagulant or antiplatelet                            agents. ASA Grade Assessment: II - A patient with                            mild systemic disease. After reviewing the risks                            and benefits, the patient was deemed in                            satisfactory condition to undergo the procedure.  After obtaining informed consent, the colonoscope                            was passed under direct vision. Throughout the                            procedure, the patient's blood pressure, pulse, and                            oxygen saturations were monitored continuously. The                            Olympus CF-HQ190 [id number]) Colonoscope was                            introduced through the  anus and advanced to the the                            cecum, identified by appendiceal orifice and                            ileocecal valve. The ileocecal valve, appendiceal                            orifice, and rectum were photographed. The quality                            of the bowel preparation was good. The colonoscopy                            was somewhat difficult due to significant looping                            and a tortuous colon. Successful completion of the                            procedure was aided by using manual pressure and                            straightening and shortening the scope to obtain                            bowel loop reduction. The patient tolerated the                            procedure well. Scope In: 3:15:38 PM Scope Out: 3:35:49 PM Scope Withdrawal Time: 0 hours 12 minutes 11 seconds  Total Procedure Duration: 0 hours 20 minutes 11 seconds  Findings:                 The perianal and digital rectal examinations were                            normal.  A few small localized angiodysplastic lesions                            without bleeding were found at the hepatic flexure                            and in the cecum.                           A single medium-mouthed diverticulum was found in                            the transverse colon. There was no evidence of                            diverticular bleeding.                           The exam was otherwise without abnormality on                            direct and retroflexion views.                           A few small-mouthed diverticula were found in the                            sigmoid colon. Complications:            No immediate complications. Estimated blood loss:                            None. Estimated Blood Loss:     Estimated blood loss: none. Impression:               - A few non-bleeding right colonic angiodysplastic                             lesions.                           - Mild diverticulosis in the transverse colon.                           - The examination was otherwise normal on direct                            and retroflexion views.                           - Mild diverticulosis in the sigmoid colon.                           - No specimens collected. Recommendation:           - Repeat colonoscopy in 10 years for surveillance.                           -  Patient has a contact number available for                            emergencies. The signs and symptoms of potential                            delayed complications were discussed with the                            patient. Return to normal activities tomorrow.                            Written discharge instructions were provided to the                            patient.                           - High fiber diet.                           - Continue present medications. Ladene Artist, MD 03/31/2021 3:40:58 PM This report has been signed electronically.

## 2021-03-31 NOTE — Progress Notes (Signed)
Pt's states no medical or surgical changes since previsit or office visit.   VS taken by CW 

## 2021-03-31 NOTE — Progress Notes (Signed)
Report given to PACU, vss 

## 2021-03-31 NOTE — Patient Instructions (Signed)
Resume previous diet Resume current medications Repeat colonoscopy in 10 years  YOU HAD AN ENDOSCOPIC PROCEDURE TODAY AT [facility]:   Refer to the procedure report that was given to you for any specific questions about what was found during the examination.  If the procedure report does not answer your questions, please call your gastroenterologist to clarify.  If you requested that your care partner not be given the details of your procedure findings, then the procedure report has been included in a sealed envelope for you to review at your convenience later.  YOU SHOULD EXPECT: Some feelings of bloating in the abdomen. Passage of more gas than usual.  Walking can help get rid of the air that was put into your GI tract during the procedure and reduce the bloating. If you had a lower endoscopy (such as a colonoscopy or flexible sigmoidoscopy) you may notice spotting of blood in your stool or on the toilet paper. If you underwent a bowel prep for your procedure, you may not have a normal bowel movement for a few days.  Please Note:  You might notice some irritation and congestion in your nose or some drainage.  This is from the oxygen used during your procedure.  There is no need for concern and it should clear up in a day or so.  SYMPTOMS TO REPORT IMMEDIATELY:  Following lower endoscopy (colonoscopy or flexible sigmoidoscopy):  Excessive amounts of blood in the stool  Significant tenderness or worsening of abdominal pains  Swelling of the abdomen that is new, acute  Fever of 100F or higher  Following upper endoscopy (EGD)  Vomiting of blood or coffee ground material  New chest pain or pain under the shoulder blades  Painful or persistently difficult swallowing  New shortness of breath  Fever of 100F or higher  Black, tarry-looking stools  For urgent or emergent issues, a gastroenterologist can be reached at any hour by calling [phone]. Do not use MyChart  messaging for urgent concerns.   DIET:  We do recommend a small meal at first, but then you may proceed to your regular diet.  Drink plenty of fluids but you should avoid alcoholic beverages for 24 hours.  ACTIVITY:  You should plan to take it easy for the rest of today and you should NOT DRIVE or use heavy machinery until tomorrow (because of the sedation medicines used during the test).    FOLLOW UP: Our staff will call the number listed on your records 48-72 hours following your procedure to check on you and address any questions or concerns that you may have regarding the information given to you following your procedure. If we do not reach you, we will leave a message.  We will attempt to reach you two times.  During this call, we will ask if you have developed any symptoms of COVID 19. If you develop any symptoms (ie: fever, flu-like symptoms, shortness of breath, cough etc.) before then, please call [phone].  If you test positive for Covid 19 in the 2 weeks post procedure, please call and report this information to [place].    If any biopsies were taken you will be contacted by phone or by letter within the next 1-3 weeks.  Please call us at [phone] if you have not heard about the biopsies in 3 weeks.   SIGNATURES/CONFIDENTIALITY: You and/or your care partner have signed paperwork which will be entered into your electronic medical record.  These signatures attest to  the fact that that the information above on your After Visit Summary has been reviewed and is understood.  Full responsibility of the confidentiality of this discharge information lies with you and/or your care-partner.

## 2021-04-02 ENCOUNTER — Telehealth: Payer: Self-pay | Admitting: *Deleted

## 2021-04-02 ENCOUNTER — Telehealth: Payer: Self-pay

## 2021-04-02 NOTE — Telephone Encounter (Signed)
Attempted f/u call. No answer left VM.  

## 2021-04-02 NOTE — Telephone Encounter (Signed)
Left message on f/u call 

## 2021-09-01 DIAGNOSIS — E7800 Pure hypercholesterolemia, unspecified: Secondary | ICD-10-CM | POA: Diagnosis not present

## 2021-09-01 DIAGNOSIS — K219 Gastro-esophageal reflux disease without esophagitis: Secondary | ICD-10-CM | POA: Diagnosis not present

## 2021-09-01 DIAGNOSIS — I10 Essential (primary) hypertension: Secondary | ICD-10-CM | POA: Diagnosis not present

## 2022-01-07 DIAGNOSIS — J0100 Acute maxillary sinusitis, unspecified: Secondary | ICD-10-CM | POA: Diagnosis not present

## 2022-02-21 DIAGNOSIS — H2513 Age-related nuclear cataract, bilateral: Secondary | ICD-10-CM | POA: Diagnosis not present

## 2022-03-03 DIAGNOSIS — K219 Gastro-esophageal reflux disease without esophagitis: Secondary | ICD-10-CM | POA: Diagnosis not present

## 2022-03-03 DIAGNOSIS — E7800 Pure hypercholesterolemia, unspecified: Secondary | ICD-10-CM | POA: Diagnosis not present

## 2022-03-03 DIAGNOSIS — Z1331 Encounter for screening for depression: Secondary | ICD-10-CM | POA: Diagnosis not present

## 2022-03-03 DIAGNOSIS — Z0000 Encounter for general adult medical examination without abnormal findings: Secondary | ICD-10-CM | POA: Diagnosis not present

## 2022-03-03 DIAGNOSIS — I10 Essential (primary) hypertension: Secondary | ICD-10-CM | POA: Diagnosis not present

## 2022-04-18 ENCOUNTER — Other Ambulatory Visit: Payer: Self-pay | Admitting: Family Medicine

## 2022-04-18 ENCOUNTER — Ambulatory Visit
Admission: RE | Admit: 2022-04-18 | Discharge: 2022-04-18 | Disposition: A | Discharge status: Home / Self Care | Payer: Medicare PPO | Source: Ambulatory Visit | Attending: Family Medicine | Admitting: Family Medicine

## 2022-04-18 DIAGNOSIS — Z1231 Encounter for screening mammogram for malignant neoplasm of breast: Secondary | ICD-10-CM

## 2022-06-14 IMAGING — MG MM DIGITAL SCREENING BILAT W/ TOMO AND CAD
8 series · 8 of 24 positions shown · non-contrast
Comparison: Previous exam(s).

CLINICAL DATA: Screening.

EXAM:
DIGITAL SCREENING BILATERAL MAMMOGRAM WITH TOMOSYNTHESIS AND CAD
TECHNIQUE: Bilateral screening digital craniocaudal and mediolateral oblique
mammograms were obtained. Bilateral screening digital breast
tomosynthesis was performed. The images were evaluated with
computer-aided detection.

[R CC synth-2D]
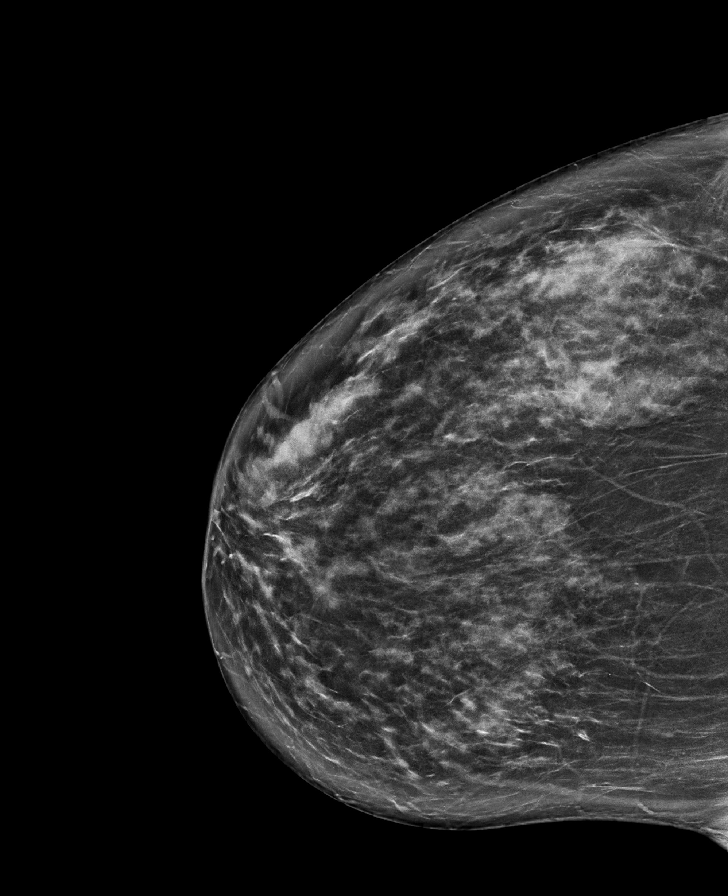

[R MLO synth-2D]
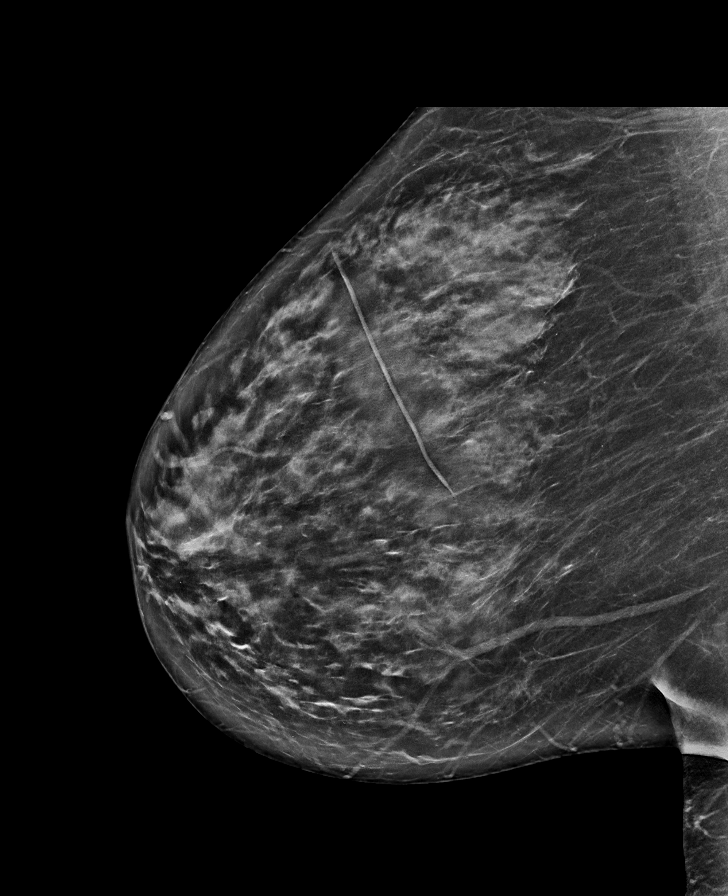

[L MLO synth-2D]
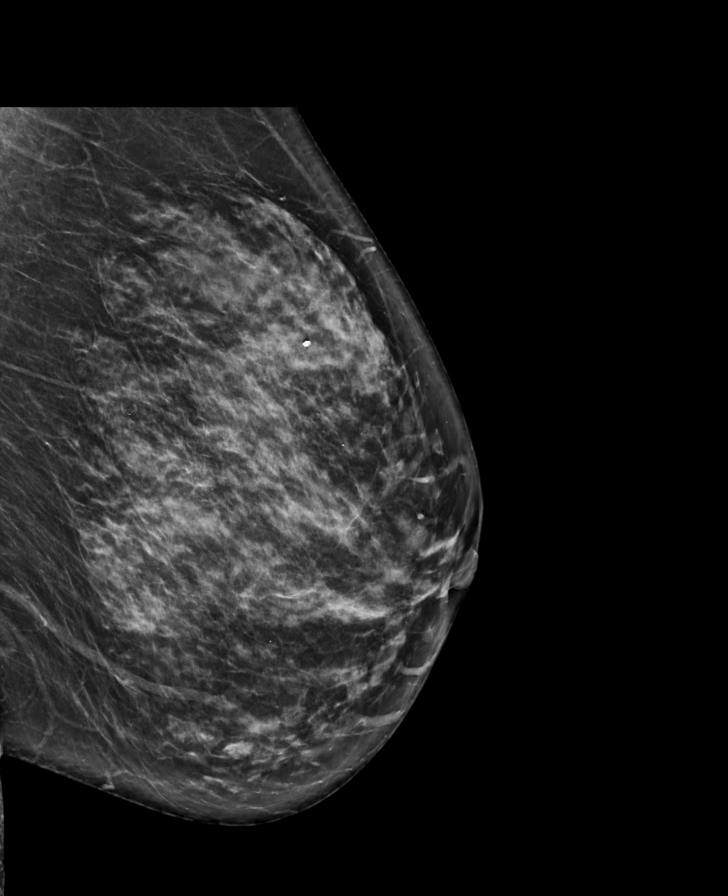

[L CC synth-2D]
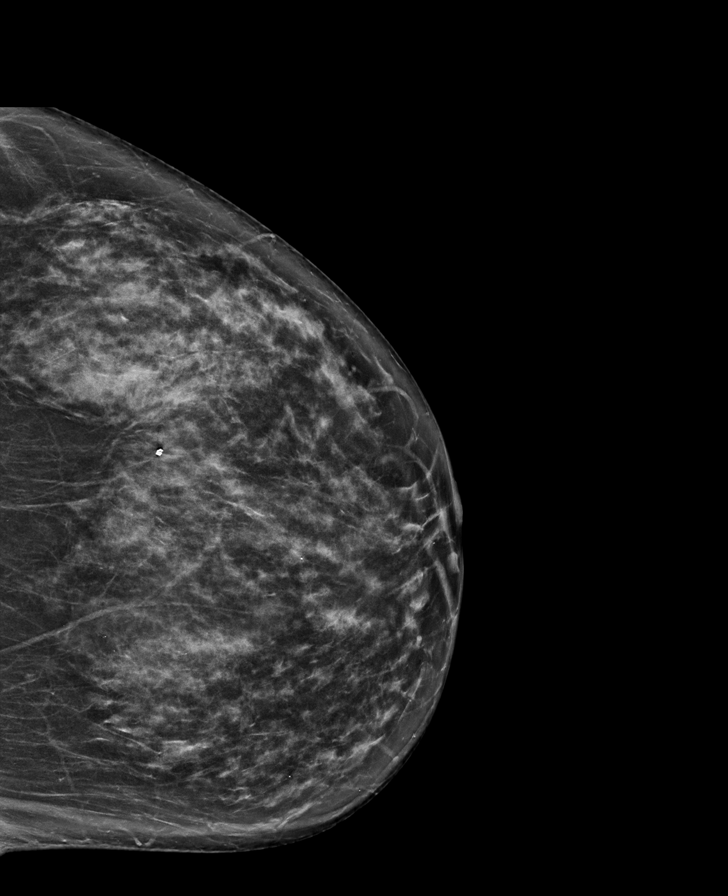

[R MLO tomo · tomo slice 41/81.0]
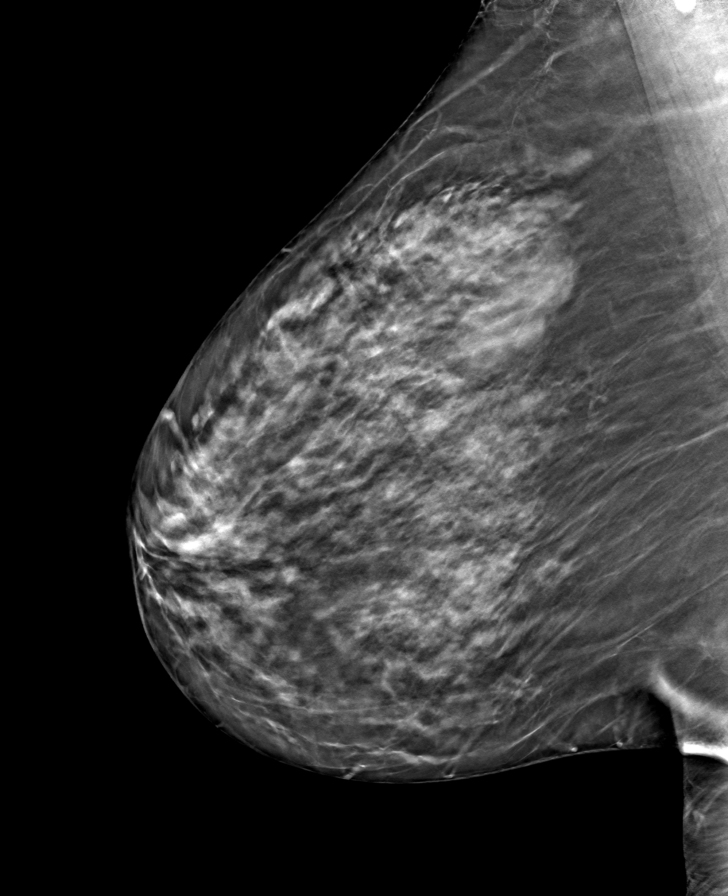

[L CC tomo · tomo slice 37/73.0]
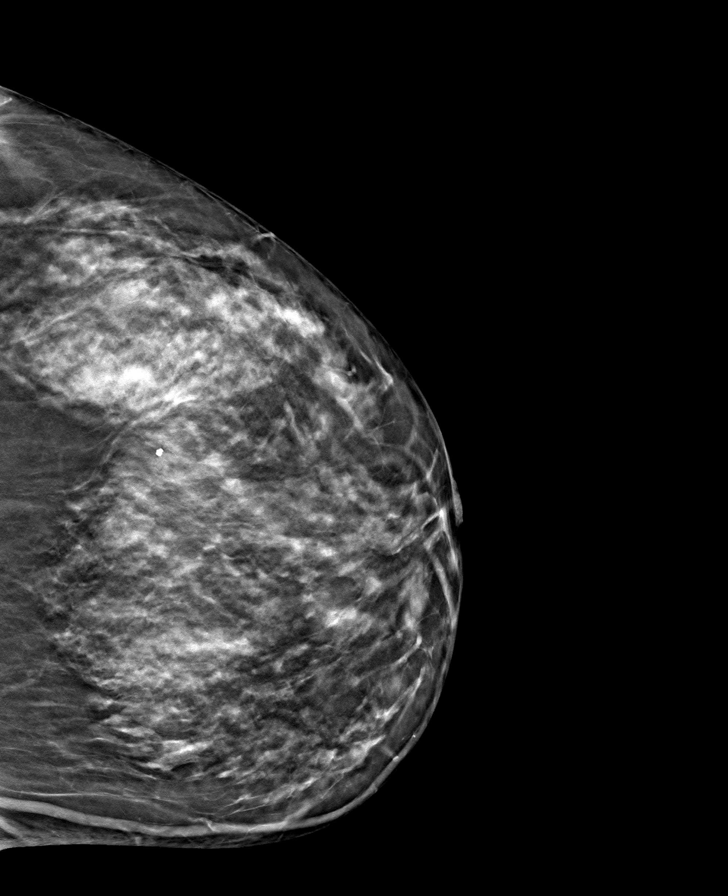

[L MLO tomo · tomo slice 39/77.0]
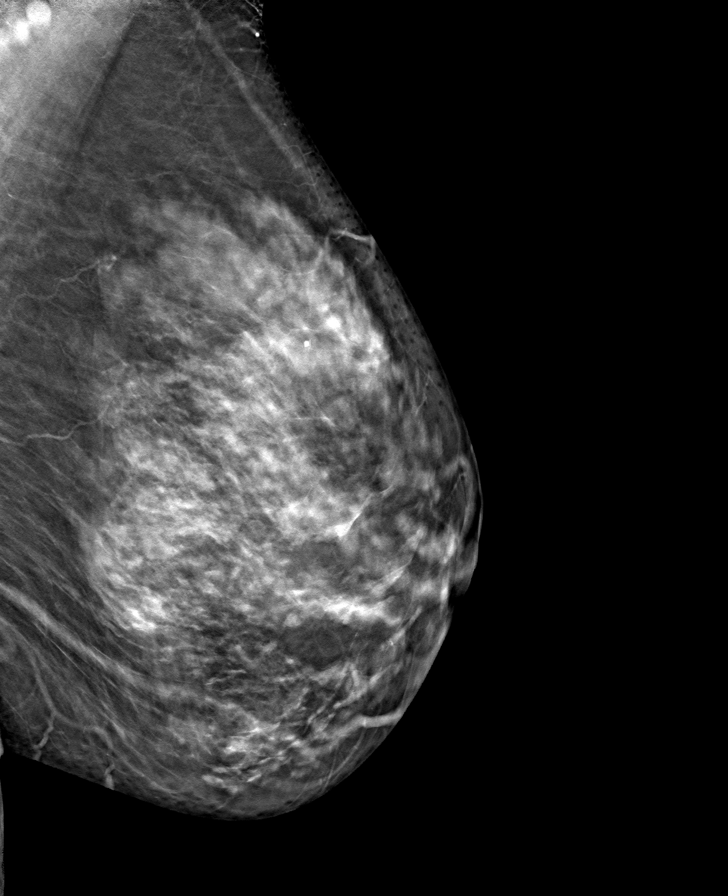

[R CC tomo · tomo slice 39/76.0]
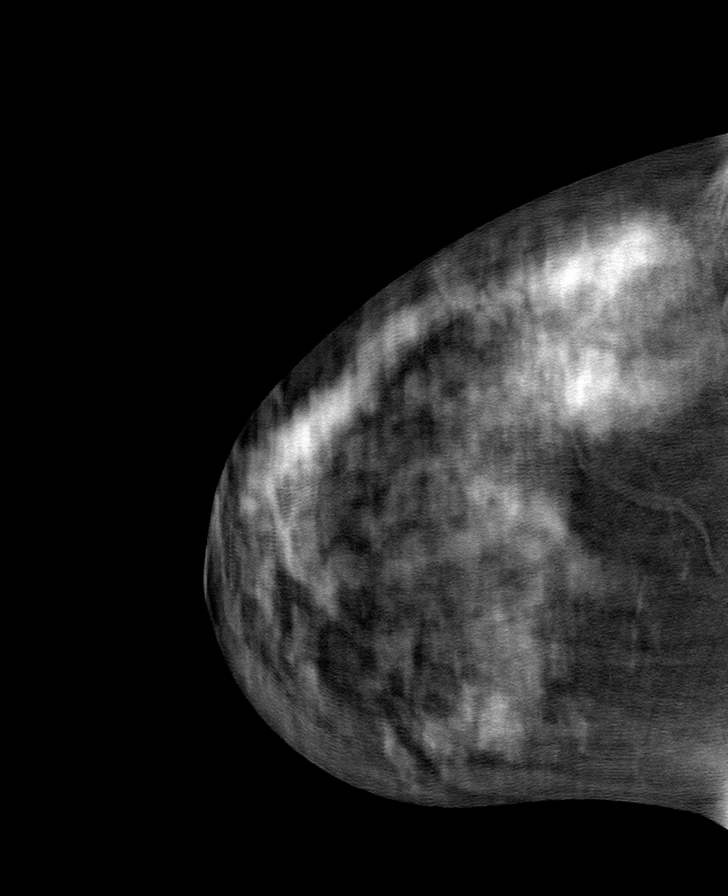

[8 of 24 positions shown; findings below may reference images not displayed]

ACR Breast Density Category c: The breast tissue is heterogeneously
dense, which may obscure small masses.
FINDINGS: There are no findings suspicious for malignancy. The images were
evaluated with computer-aided detection.
IMPRESSION: No mammographic evidence of malignancy. A result letter of this
screening mammogram will be mailed directly to the patient.

RECOMMENDATION:
Screening mammogram in one year. (Code:T4-5-GWO)

BI-RADS CATEGORY  1: Negative.

## 2022-09-07 DIAGNOSIS — K219 Gastro-esophageal reflux disease without esophagitis: Secondary | ICD-10-CM | POA: Diagnosis not present

## 2022-09-07 DIAGNOSIS — E7800 Pure hypercholesterolemia, unspecified: Secondary | ICD-10-CM | POA: Diagnosis not present

## 2022-09-07 DIAGNOSIS — I10 Essential (primary) hypertension: Secondary | ICD-10-CM | POA: Diagnosis not present

## 2023-03-08 DIAGNOSIS — Z9181 History of falling: Secondary | ICD-10-CM | POA: Diagnosis not present

## 2023-03-08 DIAGNOSIS — Z0000 Encounter for general adult medical examination without abnormal findings: Secondary | ICD-10-CM | POA: Diagnosis not present

## 2023-03-08 DIAGNOSIS — Z1331 Encounter for screening for depression: Secondary | ICD-10-CM | POA: Diagnosis not present

## 2023-03-08 DIAGNOSIS — I10 Essential (primary) hypertension: Secondary | ICD-10-CM | POA: Diagnosis not present

## 2023-03-08 DIAGNOSIS — E7800 Pure hypercholesterolemia, unspecified: Secondary | ICD-10-CM | POA: Diagnosis not present

## 2023-03-08 DIAGNOSIS — K219 Gastro-esophageal reflux disease without esophagitis: Secondary | ICD-10-CM | POA: Diagnosis not present

## 2023-03-20 DIAGNOSIS — H2513 Age-related nuclear cataract, bilateral: Secondary | ICD-10-CM | POA: Diagnosis not present

## 2023-07-19 ENCOUNTER — Other Ambulatory Visit: Payer: Self-pay | Admitting: Family Medicine

## 2023-07-19 DIAGNOSIS — Z1231 Encounter for screening mammogram for malignant neoplasm of breast: Secondary | ICD-10-CM

## 2023-07-29 ENCOUNTER — Ambulatory Visit
Admission: RE | Admit: 2023-07-29 | Discharge: 2023-07-29 | Disposition: A | Discharge status: Home / Self Care | Payer: Medicare PPO | Source: Ambulatory Visit | Attending: Family Medicine | Admitting: Family Medicine

## 2023-07-29 DIAGNOSIS — Z1231 Encounter for screening mammogram for malignant neoplasm of breast: Secondary | ICD-10-CM | POA: Diagnosis not present

## 2023-09-08 DIAGNOSIS — I10 Essential (primary) hypertension: Secondary | ICD-10-CM | POA: Diagnosis not present

## 2023-09-08 DIAGNOSIS — K219 Gastro-esophageal reflux disease without esophagitis: Secondary | ICD-10-CM | POA: Diagnosis not present

## 2023-09-08 DIAGNOSIS — E7800 Pure hypercholesterolemia, unspecified: Secondary | ICD-10-CM | POA: Diagnosis not present
